# Patient Record
Sex: Female | Born: 1935 | Race: White | Hispanic: No | Smoking: Never smoker
Health system: Southern US, Community
[De-identification: ages and names within clinical notes are randomized; demographics above are authoritative.]

---

## 2019-02-09 ENCOUNTER — Encounter (HOSPITAL_COMMUNITY): Payer: Self-pay | Admitting: *Deleted

## 2019-02-09 ENCOUNTER — Emergency Department (HOSPITAL_COMMUNITY): Payer: Medicare Other

## 2019-02-09 ENCOUNTER — Other Ambulatory Visit: Payer: Self-pay

## 2019-02-09 ENCOUNTER — Emergency Department (HOSPITAL_COMMUNITY)
Admission: EM | Admit: 2019-02-09 | Discharge: 2019-02-09 | Disposition: A | Discharge status: Home / Self Care | Payer: Medicare Other | Attending: Emergency Medicine | Admitting: Emergency Medicine

## 2019-02-09 DIAGNOSIS — Y999 Unspecified external cause status: Secondary | ICD-10-CM | POA: Diagnosis not present

## 2019-02-09 DIAGNOSIS — F0390 Unspecified dementia without behavioral disturbance: Secondary | ICD-10-CM | POA: Insufficient documentation

## 2019-02-09 DIAGNOSIS — W01190A Fall on same level from slipping, tripping and stumbling with subsequent striking against furniture, initial encounter: Secondary | ICD-10-CM | POA: Insufficient documentation

## 2019-02-09 DIAGNOSIS — Z7982 Long term (current) use of aspirin: Secondary | ICD-10-CM | POA: Insufficient documentation

## 2019-02-09 DIAGNOSIS — R51 Headache: Secondary | ICD-10-CM | POA: Insufficient documentation

## 2019-02-09 DIAGNOSIS — W19XXXA Unspecified fall, initial encounter: Secondary | ICD-10-CM

## 2019-02-09 DIAGNOSIS — Z79899 Other long term (current) drug therapy: Secondary | ICD-10-CM | POA: Diagnosis not present

## 2019-02-09 DIAGNOSIS — Y939 Activity, unspecified: Secondary | ICD-10-CM | POA: Diagnosis not present

## 2019-02-09 DIAGNOSIS — Y92129 Unspecified place in nursing home as the place of occurrence of the external cause: Secondary | ICD-10-CM | POA: Diagnosis not present

## 2019-02-09 NOTE — ED Provider Notes (Signed)
[facility] EMERGENCY DEPARTMENT Provider Note   CSN: [id number] Arrival date & time: 02/09/19  1521    LEVEL 5 CAVEAT - DEMENTIA   History   Chief Complaint Chief Complaint  Patient presents with  . Fall    HPI Sherri Kirk is a 85 y.o. female.     HPI  85 year old female presents from [facility] after a fall.  Apparently she was witnessed falling and striking her head on a table.  Did not appear to have lost consciousness.  I spoke to staff who states that the patient was standing in the room and when another staff member talked to the patient, she appeared startled and started to turn backwards away from the person who spoke to her and then she lost balance and hit her head on the table.  Otherwise, she was diagnosed with COVID on 8/18 but has been asymptomatic, including no fevers or vomiting. No altered mental status from baseline.  History reviewed. No pertinent past medical history.  There are no active problems to display for this patient.   History reviewed. No pertinent surgical history.   OB History   No obstetric history on file.      Home Medications    Prior to Admission medications   Medication Sig Start Date End Date Taking? Authorizing Provider  allopurinol (ZYLOPRIM) 100 MG tablet Take 100 mg by mouth daily.   Yes [provider]  ascorbic acid (VITAMIN C) 500 MG tablet Take 500 mg by mouth daily.   Yes [provider]  aspirin 81 MG chewable tablet Chew 81 mg by mouth daily.   Yes [provider]  busPIRone (BUSPAR) 5 MG tablet Take 5 mg by mouth 3 (three) times daily.   Yes [provider]  diltiazem (CARDIZEM CD) 120 MG 24 hr capsule Take 120 mg by mouth daily.   Yes [provider]  docusate sodium (COLACE) 100 MG capsule Take 100 mg by mouth 2 (two) times daily.   Yes [provider]  enoxaparin (LOVENOX) 30 MG/0.3ML injection Inject 30 mg into the skin every 12 (twelve) hours.    Yes [provider]  ferrous sulfate 325 (65 FE) MG tablet Take 325 mg by mouth daily with breakfast.   Yes [provider]  FLUoxetine (PROZAC) 40 MG capsule Take 40 mg by mouth daily.   Yes [provider]  HYDROcodone-acetaminophen (NORCO/VICODIN) 5-325 MG tablet Take 1 tablet by mouth 2 (two) times daily.   Yes [provider]  insulin glargine (LANTUS) 100 UNIT/ML injection Inject 13 Units into the skin daily.   Yes [provider]  losartan (COZAAR) 25 MG tablet Take 25 mg by mouth daily.   Yes [provider]  Melatonin ER 1 MG TBCR Take 1 tablet by mouth at bedtime.   Yes [provider]  metFORMIN (GLUCOPHAGE) 500 MG tablet Take by mouth 2 (two) times daily with a meal.   Yes [provider]  omeprazole (PRILOSEC) 20 MG capsule Take 20 mg by mouth daily.   Yes [provider]  polyethylene glycol (MIRALAX / GLYCOLAX) 17 g packet Take 17 g by mouth daily.   Yes [provider]  QUEtiapine (SEROQUEL) 25 MG tablet Take 25 mg by mouth at bedtime.   Yes [provider]  sennosides-docusate sodium (SENOKOT-S) 8.6-50 MG tablet Take 2 tablets by mouth daily.   Yes [provider]  sitaGLIPtin (JANUVIA) 50 MG tablet Take 50 mg by mouth  daily.   Yes [provider]  vitamin B-12 (CYANOCOBALAMIN) 1000 MCG tablet Take 1,000 mcg by mouth daily.   Yes [provider]  zinc sulfate 220 (50 Zn) MG capsule Take 220 mg by mouth daily.   Yes [provider]    Family History History reviewed. No pertinent family history.  Social History Social History   Tobacco Use  . Smoking status: Never Smoker  . Smokeless tobacco: Never Used  Substance Use Topics  . Alcohol use: Not Currently  . Drug use: Not Currently     Allergies   Macrobid [nitrofurantoin]   Review of Systems Review of Systems  Unable to perform ROS: Dementia     Physical Exam Updated Vital  Signs BP 140/67 (BP Location: Right Arm)   Pulse 75   Temp 98.1 F (36.7 C) (Oral)   Resp 14   SpO2 95%   Physical Exam Vitals signs and nursing note reviewed.  Constitutional:      Appearance: She is well-developed.     Interventions: Cervical collar in place.  HENT:     Head: Normocephalic and atraumatic.     Right Ear: External ear normal.     Left Ear: External ear normal.     Nose: Nose normal.  Eyes:     General:        Right eye: No discharge.        Left eye: No discharge.     Pupils: Pupils are equal, round, and reactive to light.  Cardiovascular:     Rate and Rhythm: Normal rate and regular rhythm.     Heart sounds: Normal heart sounds.  Pulmonary:     Effort: Pulmonary effort is normal.     Breath sounds: Normal breath sounds.  Abdominal:     Palpations: Abdomen is soft.     Tenderness: There is no abdominal tenderness.  Musculoskeletal:     Right hip: She exhibits normal range of motion and no tenderness.     Left hip: She exhibits normal range of motion and no tenderness.  Skin:    General: Skin is warm and dry.     Comments: Multiple skin tears with steri strips in place in bilateral arms  Neurological:     Mental Status: She is alert. She is disoriented.     Comments: Awake, alert, disoriented. No facial droop. 5/5 strength in all 4 extremities.   Psychiatric:        Mood and Affect: Mood is not anxious.      ED Treatments / Results  Labs (all labs ordered are listed, but only abnormal results are displayed) Labs Reviewed - No data to display  EKG EKG Interpretation  Date/Time:  Tuesday February 09 2019 15:29:59 EDT Ventricular Rate:  74 PR Interval:    QRS Duration: 125 QT Interval:  416 QTC Calculation: 462 R Axis:   -6 Text Interpretation:  Sinus rhythm Left bundle branch block No old tracing to compare Confirmed by Pricilla LovelessGoldston, Kemarion Abbey [phone]([id number]) on 02/09/2019 4:04:54 PM   Radiology Ct Head Wo Contrast  Result Date: 02/09/2019 CLINICAL  DATA:  Head trauma. EXAM: CT HEAD WITHOUT CONTRAST CT CERVICAL SPINE WITHOUT CONTRAST TECHNIQUE: Multidetector CT imaging of the head and cervical spine was performed following the standard protocol without intravenous contrast. Multiplanar CT image reconstructions of the cervical spine were also generated. COMPARISON:  None. FINDINGS: CT HEAD FINDINGS Brain: No evidence of acute infarction, hemorrhage, hydrocephalus, extra-axial collection or mass lesion/mass effect. Age related atrophy  and chronic microvascular ischemic changes are noted. Exam was limited by significant motion artifact. Vascular: No hyperdense vessel or unexpected calcification. Skull: Normal. Negative for fracture or focal lesion. Sinuses/Orbits: No acute finding. Other: None. CT CERVICAL SPINE FINDINGS Evaluation of the cervical spine is essentially nondiagnostic secondary to extensive motion artifact. The examination was terminated due to ongoing patient movement. IMPRESSION: 1. Essentially nondiagnostic examination of the cervical spine due to patient motion. The exam was terminated prior to completion. The scanned portions are essentially nondiagnostic. If there is high clinical suspicion for cervical spine injury, repeat imaging is recommended. 2. Severely motion degraded examination of the head. Given this limitation, no acute intracranial abnormality was detected. 3. Age related atrophy is noted. Electronically Signed   By: Katherine Mantlehristopher  Green M.D.   On: 02/09/2019 19:12   Ct Cervical Spine Wo Contrast  Result Date: 02/09/2019 CLINICAL DATA:  Head trauma. EXAM: CT HEAD WITHOUT CONTRAST CT CERVICAL SPINE WITHOUT CONTRAST TECHNIQUE: Multidetector CT imaging of the head and cervical spine was performed following the standard protocol without intravenous contrast. Multiplanar CT image reconstructions of the cervical spine were also generated. COMPARISON:  None. FINDINGS: CT HEAD FINDINGS Brain: No evidence of acute infarction, hemorrhage,  hydrocephalus, extra-axial collection or mass lesion/mass effect. Age related atrophy and chronic microvascular ischemic changes are noted. Exam was limited by significant motion artifact. Vascular: No hyperdense vessel or unexpected calcification. Skull: Normal. Negative for fracture or focal lesion. Sinuses/Orbits: No acute finding. Other: None. CT CERVICAL SPINE FINDINGS Evaluation of the cervical spine is essentially nondiagnostic secondary to extensive motion artifact. The examination was terminated due to ongoing patient movement. IMPRESSION: 1. Essentially nondiagnostic examination of the cervical spine due to patient motion. The exam was terminated prior to completion. The scanned portions are essentially nondiagnostic. If there is high clinical suspicion for cervical spine injury, repeat imaging is recommended. 2. Severely motion degraded examination of the head. Given this limitation, no acute intracranial abnormality was detected. 3. Age related atrophy is noted. Electronically Signed   By: Katherine Mantlehristopher  Green M.D.   On: 02/09/2019 19:12    Procedures Procedures (including critical care time)  Medications Ordered in ED Medications - No data to display   Initial Impression / Assessment and Plan / ED Course  I have reviewed the triage vital signs and the nursing notes.  Pertinent labs & imaging results that were available during my care of the patient were reviewed by me and considered in my medical decision making (see chart for details).        Patient with a ground-level fall and head injury.  However she is not altered from baseline.  There is no external signs of trauma.  She was placed in a collar but this was causing her such severe agitation but I took it off.  She has no neck tenderness on exam and she is freely moving her neck.  Brief exam shows no weakness in her extremities.  CT head was attempted and the head appears okay though the neck was essentially nondiagnostic.  I think  at this point the risk-benefit ratio would indicate probably not trying to sedate her, which could result in injury to her and/or staff to get the CT C-spine.  Given free movement of her neck, lower suspicion with ground-level fall, and no focal neuro deficits I do not think it would be worth it.  Return precautions will be given and if she develops any new signs or symptoms she should return to the ER  but otherwise will discharge home.  Sherri Kirk was evaluated in Emergency Department on 02/09/2019 for the symptoms described in the history of present illness. She was evaluated in the context of the global COVID-19 pandemic, which necessitated consideration that the patient might be at risk for infection with the SARS-CoV-2 virus that causes COVID-19. Institutional protocols and algorithms that pertain to the evaluation of patients at risk for COVID-19 are in a state of rapid change based on information released by regulatory bodies including the CDC and federal and state organizations. These policies and algorithms were followed during the patient's care in the ED.   Final Clinical Impressions(s) / ED Diagnoses   Final diagnoses:  Fall, initial encounter    ED Discharge Orders    None       Sherwood Gambler, MD 02/09/19 1921

## 2019-02-09 NOTE — ED Triage Notes (Signed)
TC to [facility] to give report. No one answered the phone.

## 2019-02-09 NOTE — ED Triage Notes (Signed)
PT lives at [facility] and is living on [place] wing. Pt had a witnessed fall today. Pt normally lives on the [facility] Care unit at [facility]. Pt has dementia . Pt unable at answer any questions.

## 2019-02-09 NOTE — Discharge Instructions (Signed)
The CT scan of the neck was difficult to obtain as Sherri Kirk became agitated and uncooperative. If she complains of neck pain, or develops severe headache, blurry vision, weakness or numbness, change in speech, or any other new/concerning symptoms, then return to the ER for evaluation.

## 2019-02-09 NOTE — ED Notes (Signed)
Patient verbalizes understanding of discharge instructions . Opportunity for questions and answers were provided . Armband removed by staff ,Pt discharged from ED. W/C  offered at D/C  and Declined W/C at D/C and was escorted to lobby by RN.  

## 2019-02-09 NOTE — ED Notes (Signed)
Discharge paperwork given to [facility]s at time of transport back to [facility]. Report previously attempted without success. Pt was alert and at baseline at time of discharge.

## 2019-02-09 NOTE — ED Notes (Signed)
Called ptar- will be a bit

## 2019-11-09 DEATH — deceased

## 2020-09-19 IMAGING — CT CT HEAD W/O CM
3 of 6 series · 15 of 47 positions shown, 18 images · non-contrast
Comparison: None.

CLINICAL DATA: Head trauma.

EXAM:
CT HEAD WITHOUT CONTRAST
CT CERVICAL SPINE WITHOUT CONTRAST
TECHNIQUE: Multidetector CT imaging of the head and cervical spine was
performed following the standard protocol without intravenous
contrast. Multiplanar CT image reconstructions of the cervical spine
were also generated.

[Series 3: head 5.0 h30s · axial · 0.43mm/px · z∈[-118,+52]mm · 10 of 39 slices shown, 13 images]
[im 3/39  brain]
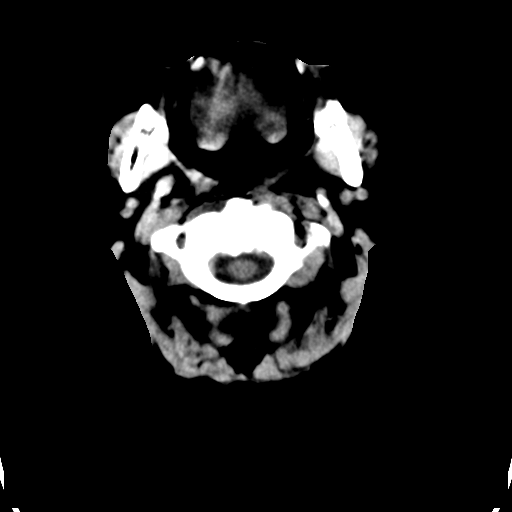
[im 3/39  bone]
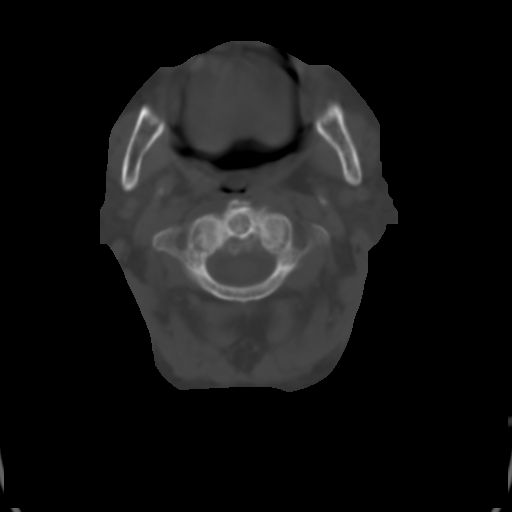
[im 7/39  brain]
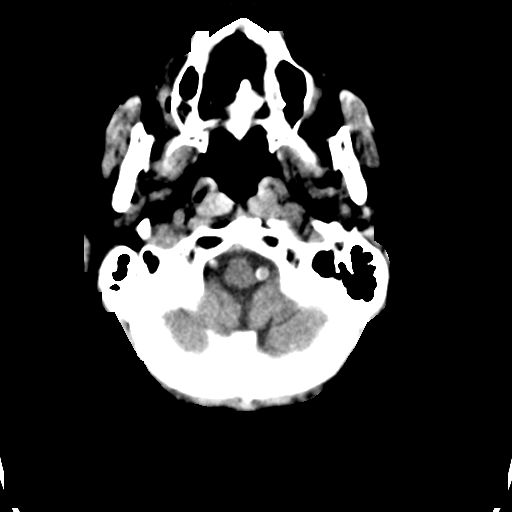
[im 11/39  brain]
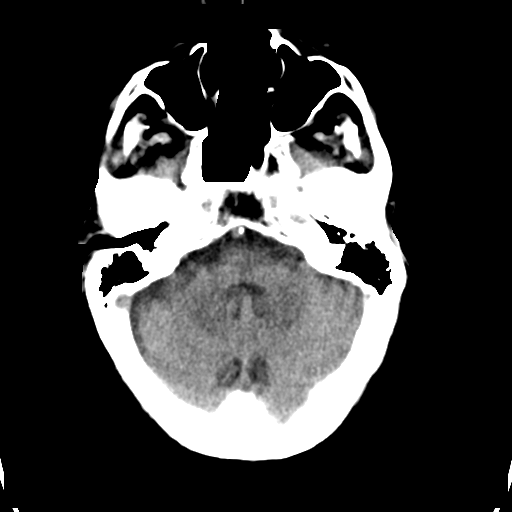
[im 15/39  brain]
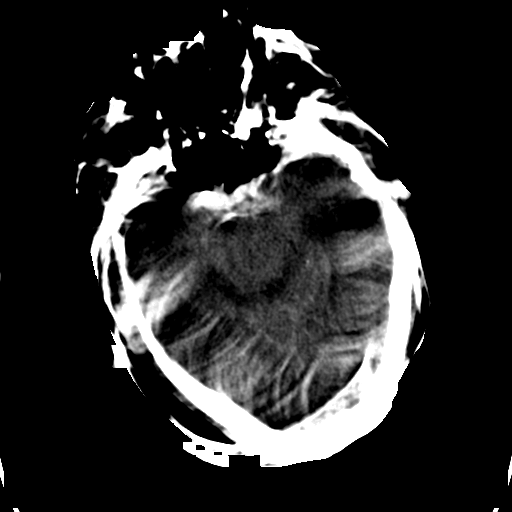
[im 19/39  brain]
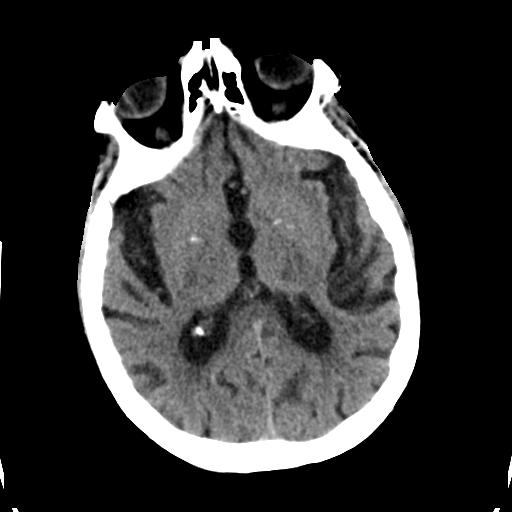
[im 19/39  bone]
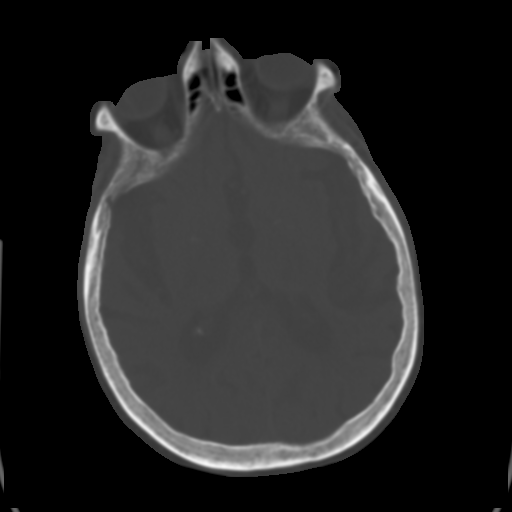
[im 21/39  brain]
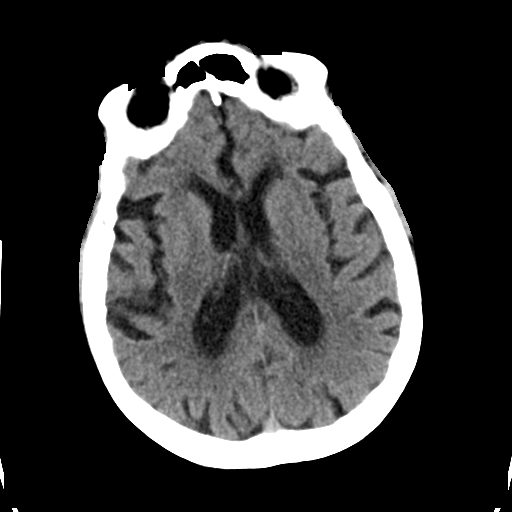
[im 25/39  brain]
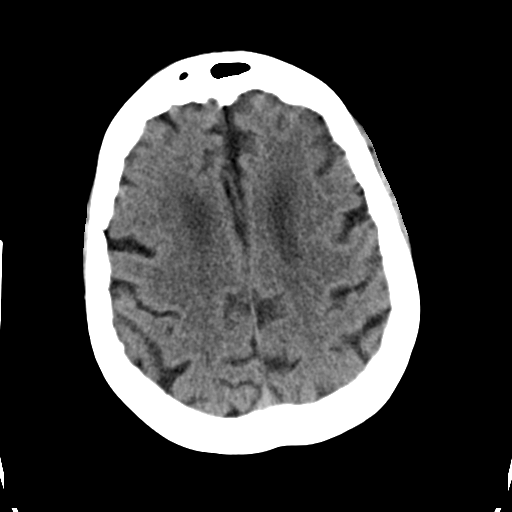
[im 29/39  brain]
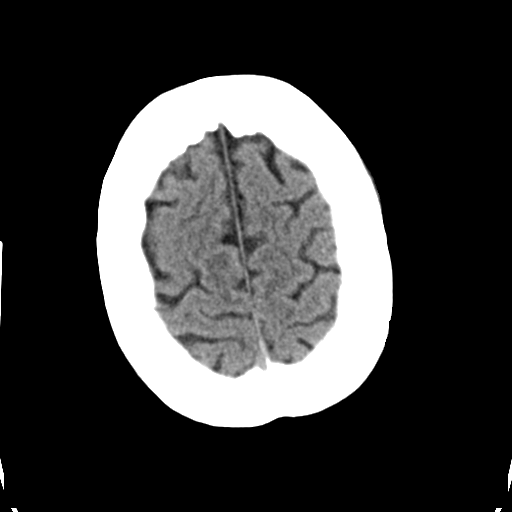
[im 33/39  brain]
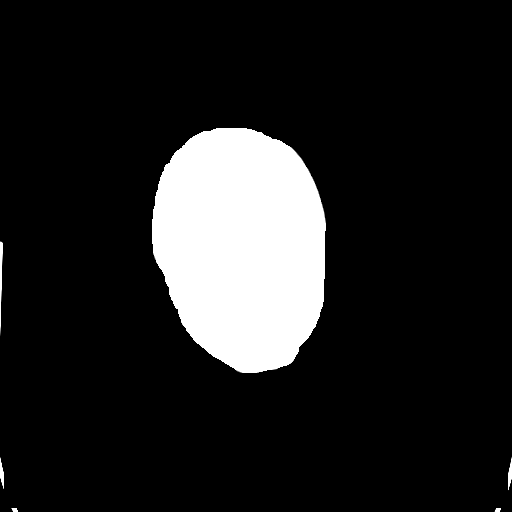
[im 33/39  bone]
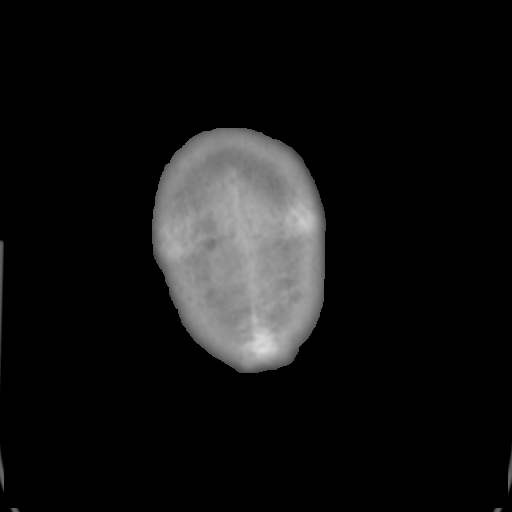
[im 37/39  brain]
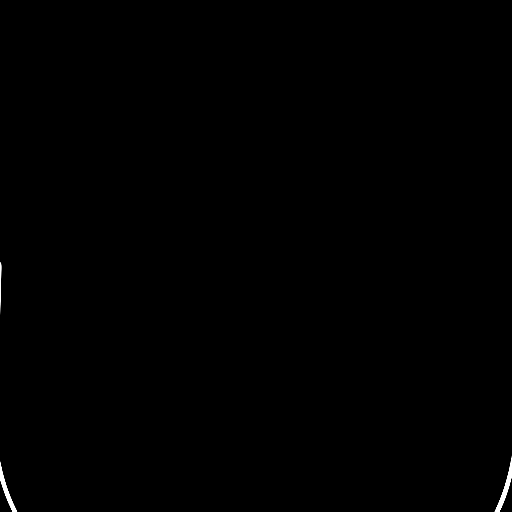

[Series 5: head 3.0 mpr cor · coronal · 0.35mm/px · 3 of 67 slices shown]
[im 17/67  brain]
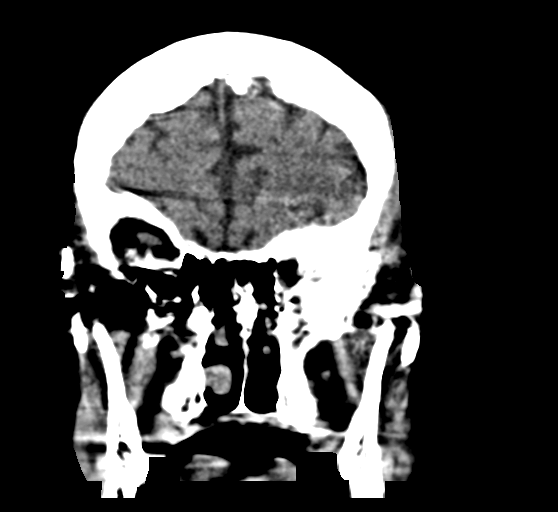
[im 34/67  brain]
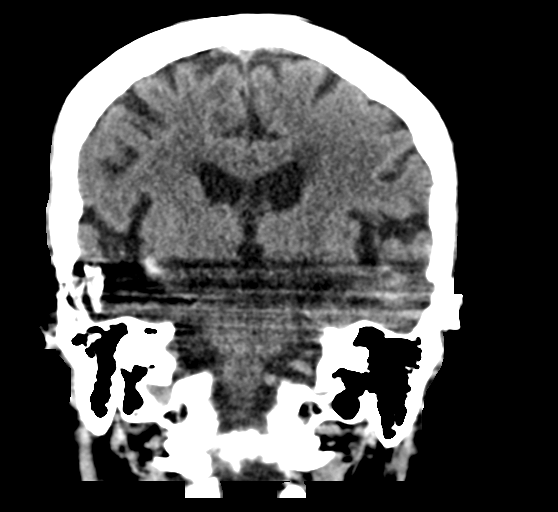
[im 50/67  brain]
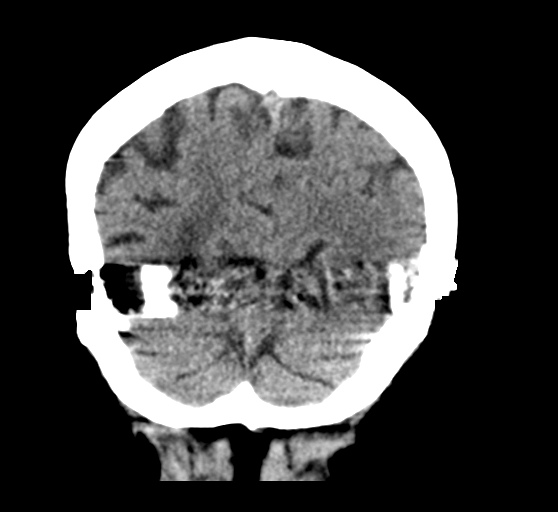

[Series 6: head 3.0 mpr sag · sagittal · 0.34mm/px · 2 of 61 slices shown]
[im 21/61  brain]
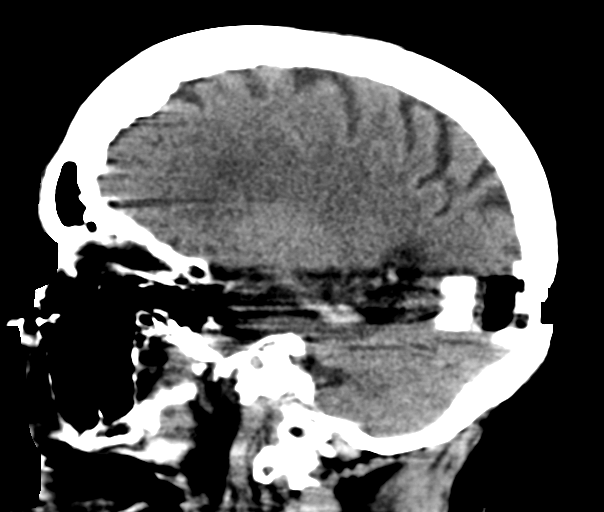
[im 41/61  brain]
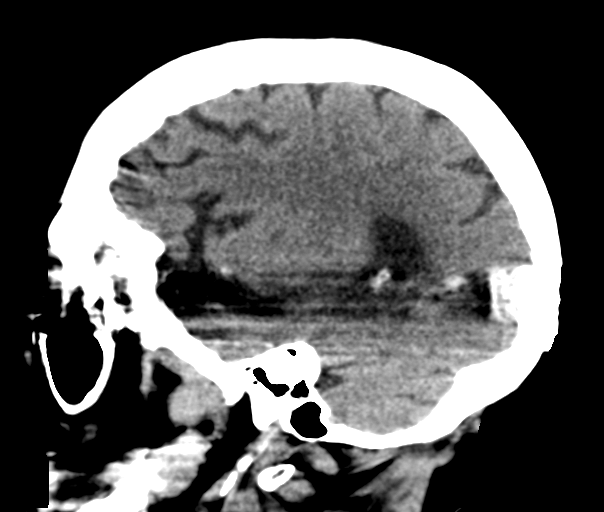

[15 of 47 positions shown; findings below may reference images not displayed]

FINDINGS: CT HEAD FINDINGS

Brain: No evidence of acute infarction, hemorrhage, hydrocephalus,
extra-axial collection or mass lesion/mass effect. Age related
atrophy and chronic microvascular ischemic changes are noted. Exam
was limited by significant motion artifact.

Vascular: No hyperdense vessel or unexpected calcification.

Skull: Normal. Negative for fracture or focal lesion.

Sinuses/Orbits: No acute finding.

Other: None.

CT CERVICAL SPINE FINDINGS

Evaluation of the cervical spine is essentially nondiagnostic
secondary to extensive motion artifact. The examination was
terminated due to ongoing patient movement.
IMPRESSION: 1. Essentially nondiagnostic examination of the cervical spine due
to patient motion. The exam was terminated prior to completion. The
scanned portions are essentially nondiagnostic. If there is high
clinical suspicion for cervical spine injury, repeat imaging is
recommended.
2. Severely motion degraded examination of the head. Given this
limitation, no acute intracranial abnormality was detected.
3. Age related atrophy is noted.

## 2020-09-19 IMAGING — CT CT CERVICAL SPINE W/O CM
1 series · 12 of 14 positions shown, 15 images · non-contrast
Comparison: None.

CLINICAL DATA: Head trauma.

EXAM:
CT HEAD WITHOUT CONTRAST
CT CERVICAL SPINE WITHOUT CONTRAST
TECHNIQUE: Multidetector CT imaging of the head and cervical spine was
performed following the standard protocol without intravenous
contrast. Multiplanar CT image reconstructions of the cervical spine
were also generated.

[Series 5: c_spine 2.0 st · axial · 0.39mm/px · z∈[-123,-71]mm · 12 of 32 slices shown, 15 images]
[im 3/32  soft-tissue]
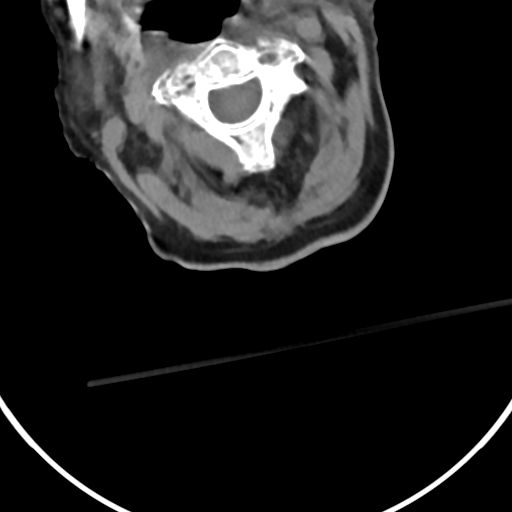
[im 3/32  bone]
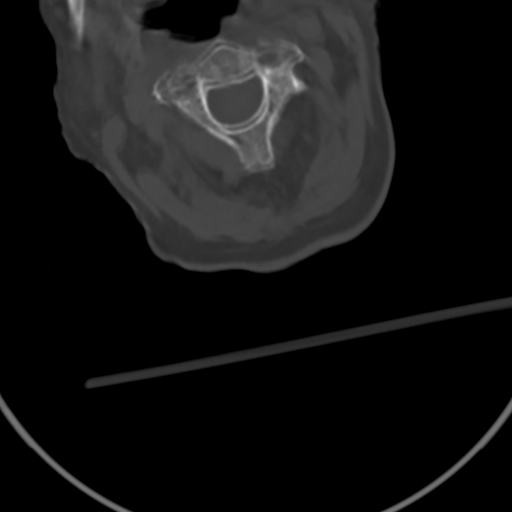
[im 5/32  bone]
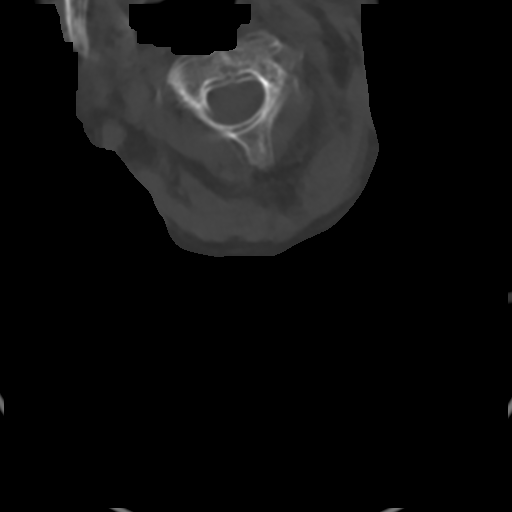
[im 8/32  bone]
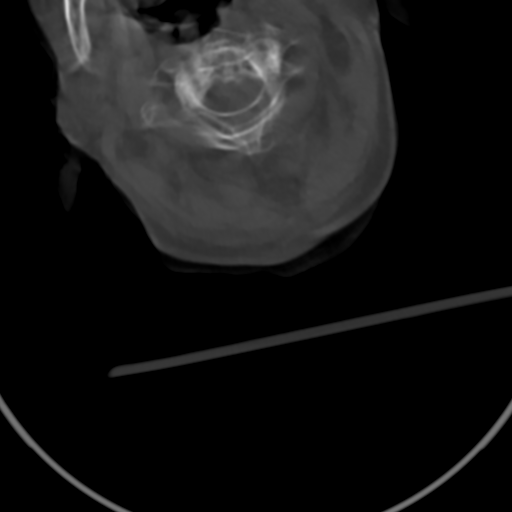
[im 10/32  bone]
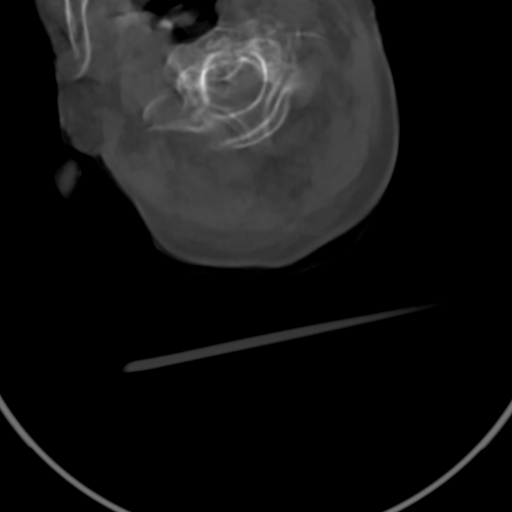
[im 12/32  soft-tissue]
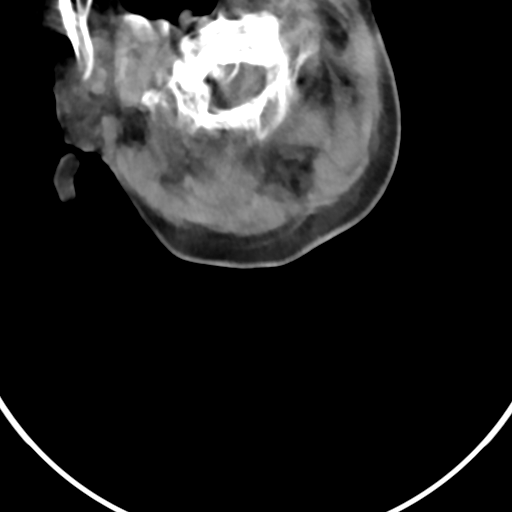
[im 12/32  bone]
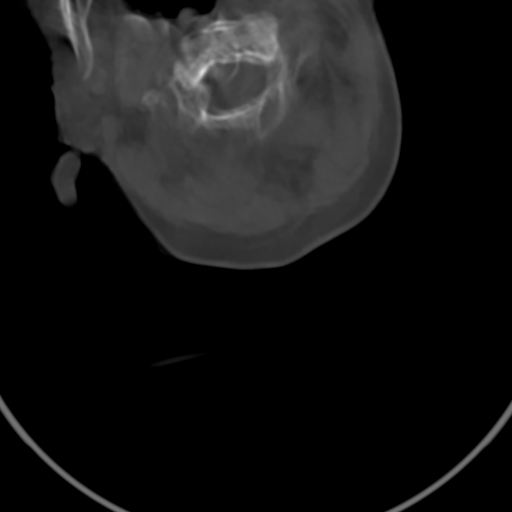
[im 15/32  bone]
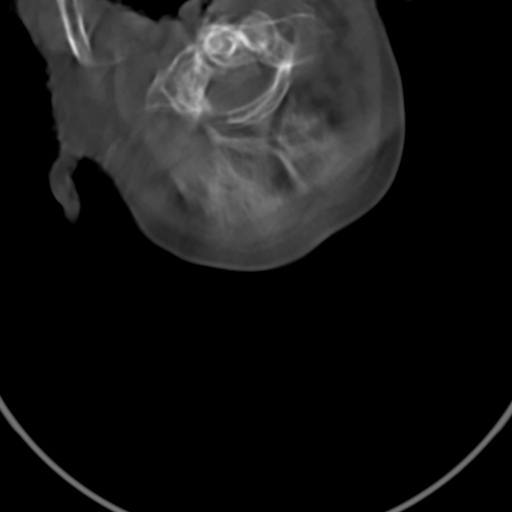
[im 17/32  bone]
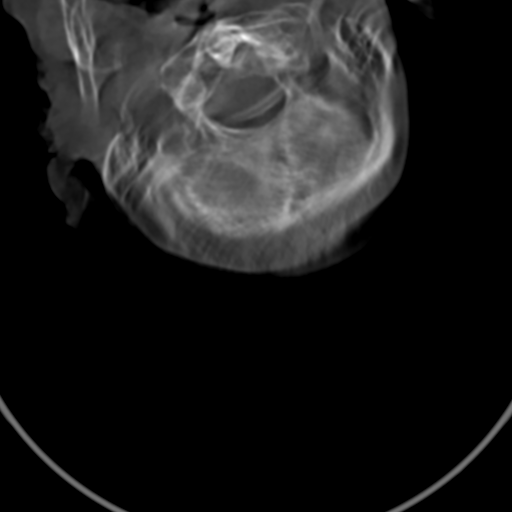
[im 20/32  bone]
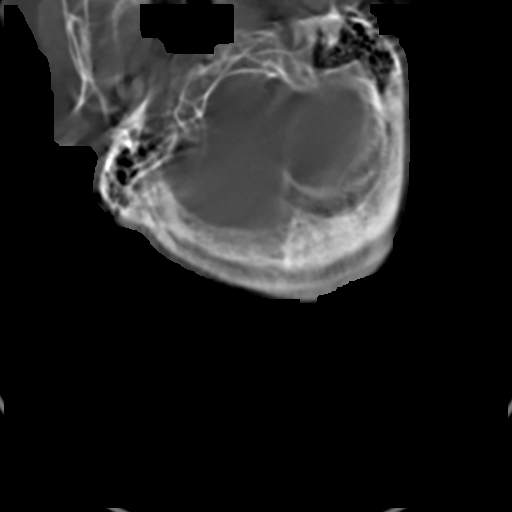
[im 22/32  soft-tissue]
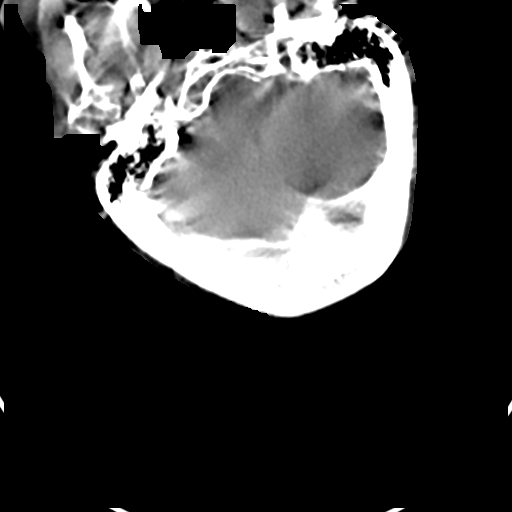
[im 22/32  bone]
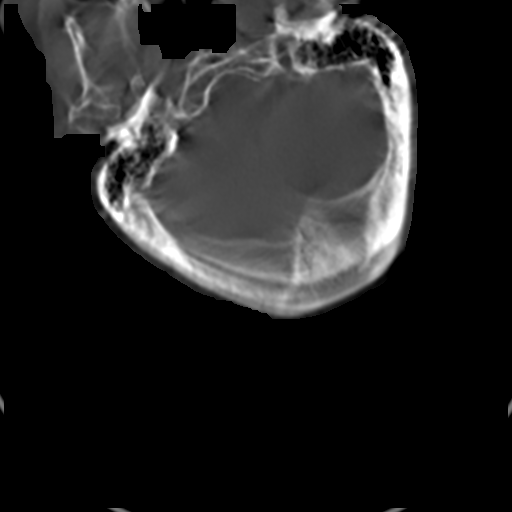
[im 24/32  bone]
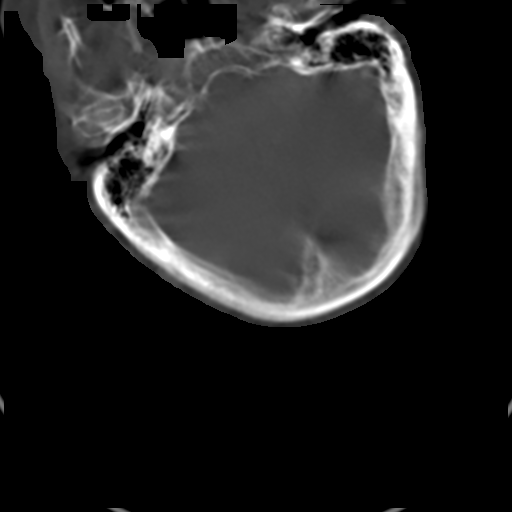
[im 27/32  bone]
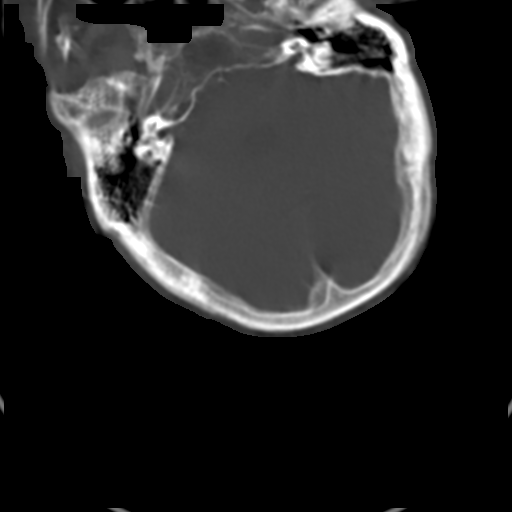
[im 29/32  bone]
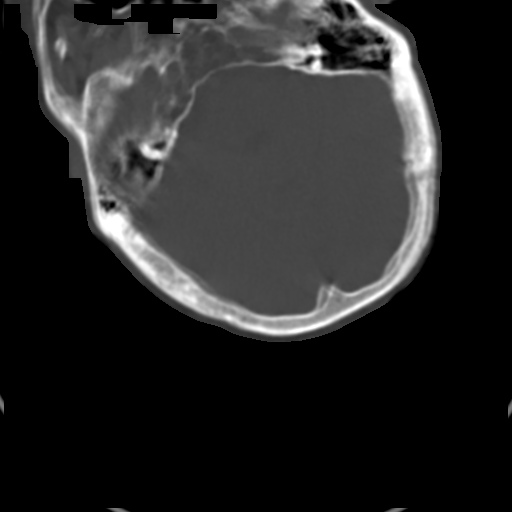

[12 of 14 positions shown; findings below may reference images not displayed]

FINDINGS: CT HEAD FINDINGS

Brain: No evidence of acute infarction, hemorrhage, hydrocephalus,
extra-axial collection or mass lesion/mass effect. Age related
atrophy and chronic microvascular ischemic changes are noted. Exam
was limited by significant motion artifact.

Vascular: No hyperdense vessel or unexpected calcification.

Skull: Normal. Negative for fracture or focal lesion.

Sinuses/Orbits: No acute finding.

Other: None.

CT CERVICAL SPINE FINDINGS

Evaluation of the cervical spine is essentially nondiagnostic
secondary to extensive motion artifact. The examination was
terminated due to ongoing patient movement.
IMPRESSION: 1. Essentially nondiagnostic examination of the cervical spine due
to patient motion. The exam was terminated prior to completion. The
scanned portions are essentially nondiagnostic. If there is high
clinical suspicion for cervical spine injury, repeat imaging is
recommended.
2. Severely motion degraded examination of the head. Given this
limitation, no acute intracranial abnormality was detected.
3. Age related atrophy is noted.
